# Patient Record
Sex: Female | Born: 1995 | Race: White | Hispanic: Yes | Marital: Single | Smoking: Never smoker
Health system: Southern US, Community
[De-identification: ages and names within clinical notes are randomized; demographics above are authoritative.]

## PROBLEM LIST (undated history)

## (undated) ENCOUNTER — Emergency Department (HOSPITAL_COMMUNITY): Discharge status: Absent without leave | Payer: Worker's Compensation

---
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
## 2015-03-09 ENCOUNTER — Encounter (HOSPITAL_COMMUNITY): Payer: Self-pay | Admitting: *Deleted

## 2015-03-09 ENCOUNTER — Emergency Department (HOSPITAL_COMMUNITY)
Admission: EM | Admit: 2015-03-09 | Discharge: 2015-03-09 | Disposition: A | Discharge status: Home / Self Care | Payer: Medicaid Other | Attending: Emergency Medicine | Admitting: Emergency Medicine

## 2015-03-09 DIAGNOSIS — Z79899 Other long term (current) drug therapy: Secondary | ICD-10-CM | POA: Insufficient documentation

## 2015-03-09 DIAGNOSIS — Z7951 Long term (current) use of inhaled steroids: Secondary | ICD-10-CM | POA: Diagnosis not present

## 2015-03-09 DIAGNOSIS — J069 Acute upper respiratory infection, unspecified: Secondary | ICD-10-CM

## 2015-03-09 DIAGNOSIS — J029 Acute pharyngitis, unspecified: Secondary | ICD-10-CM | POA: Diagnosis present

## 2015-03-09 MED ORDER — CHLORPHENIRAMINE-PSE-IBUPROFEN 2-30-200 MG PO TABS: ORAL_TABLET | ORAL | Status: DC

## 2015-03-09 NOTE — ED Notes (Signed)
Declined W/C at D/C and was escorted to lobby by RN. 

## 2015-03-09 NOTE — Discharge Instructions (Signed)
Your symptoms are likely due to a virus and should resolve on their own in the next 1-2 weeks. Please take your medications as prescribed. Follow-up with your doctor as needed for reevaluation. Return to ED for any new or worsening symptoms area  Upper Respiratory Infection, Adult Most upper respiratory infections (URIs) are a viral infection of the air passages leading to the lungs. A URI affects the nose, throat, and upper air passages. The most common type of URI is nasopharyngitis and is typically referred to as "the common cold." URIs run their course and usually go away on their own. Most of the time, a URI does not require medical attention, but sometimes a bacterial infection in the upper airways can follow a viral infection. This is called a secondary infection. Sinus and middle ear infections are common types of secondary upper respiratory infections. Bacterial pneumonia can also complicate a URI. A URI can worsen asthma and chronic obstructive pulmonary disease (COPD). Sometimes, these complications can require emergency medical care and may be life threatening.  CAUSES Almost all URIs are caused by viruses. A virus is a type of germ and can spread from one person to another.  RISKS FACTORS You may be at risk for a URI if:   You smoke.   You have chronic heart or lung disease.  You have a weakened defense (immune) system.   You are very young or very old.   You have nasal allergies or asthma.  You work in crowded or poorly ventilated areas.  You work in health care facilities or schools. SIGNS AND SYMPTOMS  Symptoms typically develop 2-3 days after you come in contact with a cold virus. Most viral URIs last 7-10 days. However, viral URIs from the influenza virus (flu virus) can last 14-18 days and are typically more severe. Symptoms may include:   Runny or stuffy (congested) nose.   Sneezing.   Cough.   Sore throat.   Headache.   Fatigue.   Fever.   Loss  of appetite.   Pain in your forehead, behind your eyes, and over your cheekbones (sinus pain).  Muscle aches.  DIAGNOSIS  Your health care provider may diagnose a URI by:  Physical exam.  Tests to check that your symptoms are not due to another condition such as:  Strep throat.  Sinusitis.  Pneumonia.  Asthma. TREATMENT  A URI goes away on its own with time. It cannot be cured with medicines, but medicines may be prescribed or recommended to relieve symptoms. Medicines may help:  Reduce your fever.  Reduce your cough.  Relieve nasal congestion. HOME CARE INSTRUCTIONS   Take medicines only as directed by your health care provider.   Gargle warm saltwater or take cough drops to comfort your throat as directed by your health care provider.  Use a warm mist humidifier or inhale steam from a shower to increase air moisture. This may make it easier to breathe.  Drink enough fluid to keep your urine clear or pale yellow.   Eat soups and other clear broths and maintain good nutrition.   Rest as needed.   Return to work when your temperature has returned to normal or as your health care provider advises. You may need to stay home longer to avoid infecting others. You can also use a face mask and careful hand washing to prevent spread of the virus.  Increase the usage of your inhaler if you have asthma.   Do not use any tobacco products, including  cigarettes, chewing tobacco, or electronic cigarettes. If you need help quitting, ask your health care provider. PREVENTION  The best way to protect yourself from getting a cold is to practice good hygiene.   Avoid oral or hand contact with people with cold symptoms.   Wash your hands often if contact occurs.  There is no clear evidence that vitamin C, vitamin E, echinacea, or exercise reduces the chance of developing a cold. However, it is always recommended to get plenty of rest, exercise, and practice good nutrition.    SEEK MEDICAL CARE IF:   You are getting worse rather than better.   Your symptoms are not controlled by medicine.   You have chills.  You have worsening shortness of breath.  You have brown or red mucus.  You have yellow or brown nasal discharge.  You have pain in your face, especially when you bend forward.  You have a fever.  You have swollen neck glands.  You have pain while swallowing.  You have white areas in the back of your throat. SEEK IMMEDIATE MEDICAL CARE IF:   You have severe or persistent:  Headache.  Ear pain.  Sinus pain.  Chest pain.  You have chronic lung disease and any of the following:  Wheezing.  Prolonged cough.  Coughing up blood.  A change in your usual mucus.  You have a stiff neck.  You have changes in your:  Vision.  Hearing.  Thinking.  Mood. MAKE SURE YOU:   Understand these instructions.  Will watch your condition.  Will get help right away if you are not doing well or get worse.   This information is not intended to replace advice given to you by your health care provider. Make sure you discuss any questions you have with your health care provider.   Document Released: 07/06/2000 Document Revised: 05/27/2014 Document Reviewed: 04/17/2013 Elsevier Interactive Patient Education [employer]2016 Elsevier Inc.

## 2015-03-09 NOTE — ED Notes (Signed)
Pt reports bil. Ear pain ,cough and sore throat started on Friday.

## 2015-03-09 NOTE — ED Provider Notes (Signed)
CSN: [id number]     Arrival date & time 03/09/15  1478 History  By signing my name below, I, Paige Dixon, attest that this documentation has been prepared under the direction and in the presence of [name], PA-C.  Electronically Signed: [name], ED Scribe 03/09/2015 at 9:20 AM.   Chief Complaint  Patient presents with  . Otalgia  . Cough  . Sore Throat    The history is provided by the patient. No language interpreter was used.   HPI Comments: Paige Dixon is a 20 y.o. female who presents to the Emergency Department complaining of gradual onset, constant otalgia in both ears, onset last week, worsening 03/06/2015. Pt reports associated rhinorrhea, cough, sore throat, and mild headache. Pt took dayquil, nyquil, and allergy medications last week with no relief of symptoms. No other alleviating or worsening factors noted. Pt denies fever, chills, nausea or vomiting, diarrhea or constipation, abdominal pain, urinary symptoms and any other associated symptoms. No other modifying factors  History reviewed. No pertinent past medical history. History reviewed. No pertinent past surgical history. History reviewed. No pertinent family history. Social History  Substance Use Topics  . Smoking status: Never Smoker   . Smokeless tobacco: None  . Alcohol Use: No   OB History    No data available     Review of Systems  Constitutional: Negative for fever.  HENT: Positive for rhinorrhea and sore throat.   Respiratory: Positive for cough.   Neurological: Positive for headaches.  All other systems reviewed and are negative.   Allergies  Review of patient's allergies indicates no known allergies.  Home Medications   Prior to Admission medications   Medication Sig Start Date End Date Taking? Authorizing Provider  amoxicillin (AMOXIL) 875 MG tablet Take 1 tablet (875 mg total) by mouth 2 (two) times daily. 09/14/12  Yes Graylon Good, PA-C  cyclobenzaprine (FLEXERIL) 10  MG tablet Take 1 tablet (10 mg total) by mouth 2 (two) times daily as needed for muscle spasms. 01/13/14  Yes Shari Upstill, PA-C  fluticasone (FLONASE) 50 MCG/ACT nasal spray Place 2 sprays into both nostrils daily. 12/18/13  Yes Hannah Muthersbaugh, PA-C  HYDROcodone-acetaminophen (NORCO/VICODIN) 5-325 MG per tablet Take 1-2 tablets by mouth every 4 (four) hours as needed. 01/13/14  Yes Shari Upstill, PA-C  ibuprofen (ADVIL,MOTRIN) 800 MG tablet Take 1 tablet (800 mg total) by mouth 3 (three) times daily. 01/13/14  Yes Elpidio Anis, PA-C  methylPREDNISolone (MEDROL DOSEPAK) 4 MG tablet Use as directed 09/14/12  Yes Graylon Good, PA-C  OVER THE COUNTER MEDICATION Take 10 mLs by mouth every 6 (six) hours as needed. dayquil day and night for cold symptoms   Yes Historical Provider, MD  Chlorpheniramine-PSE-Ibuprofen (ADVIL ALLERGY SINUS) 2-30-200 MG TABS 2 tabs PO QID PRN 03/09/15   Joycie Peek, PA-C   BP 108/62 mmHg  Pulse 81  Temp(Src) 97.7 F (36.5 C) (Oral)  Resp 16  Ht  (1.702 m)  Wt 122 lb (55.339 kg)  BMI 19.10 kg/m2  SpO2 100%  LMP 02/20/2015 Physical Exam  Constitutional: She appears well-developed and well-nourished. No distress.  HENT:  Head: Normocephalic and atraumatic.  Mouth/Throat: Posterior oropharyngeal erythema present.  Left ear posterior lymphadenopathy. No mastoid tenderness, erythema. TM normal bilaterally.  No cervical lymphadenopathy.  Mild errythema to posterior oropharynx.     Eyes: Conjunctivae and EOM are normal.  Neck: Neck supple. No tracheal deviation present.  Cardiovascular: Normal rate, regular rhythm and normal heart sounds.  Pulmonary/Chest: Effort normal and breath sounds normal. No respiratory distress. She has no wheezes. She has no rales.  Musculoskeletal: Normal range of motion.  Neurological: She is alert.  Skin: Skin is warm and dry.  Psychiatric: She has a normal mood and affect. Her behavior is normal.    ED Course   Procedures (including critical care time) DIAGNOSTIC STUDIES: Oxygen Saturation is 100% on RA, normal by my interpretation.    COORDINATION OF CARE: 9:17 AM-Discussed treatment plan which includes decongestant and cold medications to treat symptoms with pt at bedside and pt agreed to plan.   Labs Review Labs Reviewed - No data to display  Imaging Review No results found.   EKG Interpretation None     Meds given in ED:  Medications - No data to display  Discharge Medication List as of 03/09/2015  9:21 AM    \ Filed Vitals:   03/09/15 0847  BP: 108/62  Pulse: 81  Temp: 97.7 F (36.5 C)  TempSrc: Oral  Resp: 16  Height:  (1.702 m)  Weight: 55.339 kg  SpO2: 100%     MDM  Presents for evaluation of cough, runny nose, nasal congestion, otalgia. No objective findings on physical exam. Patients symptoms are consistent with URI, likely viral etiology. Discussed that antibiotics are not indicated for viral infections. Pt will be discharged with symptomatic treatment.  Verbalizes understanding and is agreeable with plan. Pt is hemodynamically stable, afebrile & in NAD prior to dc.  Final diagnoses:  URI (upper respiratory infection)    I personally performed the services described in this documentation, which was scribed in my presence. The recorded information has been reviewed and is accurate.    Joycie Peek, PA-C 03/09/15 1610  Arby Barrette, MD 03/11/15 [phone]
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
## 2015-08-13 ENCOUNTER — Encounter (HOSPITAL_COMMUNITY): Payer: Self-pay | Admitting: Emergency Medicine

## 2015-08-13 ENCOUNTER — Ambulatory Visit (HOSPITAL_COMMUNITY)
Admission: EM | Admit: 2015-08-13 | Discharge: 2015-08-13 | Disposition: A | Discharge status: Home / Self Care | Payer: Medicaid Other | Attending: Emergency Medicine | Admitting: Emergency Medicine

## 2015-08-13 DIAGNOSIS — B351 Tinea unguium: Secondary | ICD-10-CM | POA: Diagnosis not present

## 2015-08-13 MED ORDER — EFINACONAZOLE 10 % EX SOLN: CUTANEOUS | Status: DC

## 2015-08-13 NOTE — Discharge Instructions (Signed)
Nail Ringworm A fungal infection of the nail (tinea unguium/onychomycosis) is common. It is common as the visible part of the nail is composed of dead cells which have no blood supply to help prevent infection. It occurs because fungi are everywhere and will pick any opportunity to grow on any dead material. Because nails are very slow growing they require up to 2 years of treatment with anti-fungal medications. The entire nail back to the base is infected. This includes approximately  of the nail which you cannot see. If your caregiver has prescribed a medication by mouth, take it every day and as directed. No progress will be seen for at least 6 to 9 months. Do not be disappointed! Because fungi live on dead cells with little or no exposure to blood supply, medication delivery to the infection is slow; thus the cure is slow. It is also why you can observe no progress in the first 6 months. The nail becoming cured is the base of the nail, as it has the blood supply. Topical medication such as creams and ointments are usually not effective. Important in successful treatment of nail fungus is closely following the medication regimen that your doctor prescribes. Sometimes you and your caregiver may elect to speed up this process by surgical removal of all the nails. Even this may still require 6 to 9 months of additional oral medications. See your caregiver as directed. Remember there will be no visible improvement for at least 6 months. See your caregiver sooner if other signs of infection (redness and swelling) develop.   This information is not intended to replace advice given to you by your health care provider. Make sure you discuss any questions you have with your health care provider.   Document Released: 01/08/2000 Document Revised: 05/27/2014 Document Reviewed: 07/14/2014 Elsevier Interactive Patient Education 2016 Elsevier Inc.  

## 2015-08-13 NOTE — ED Notes (Signed)
Pt states she has had a nail fungus on both great toes for a week or two.  She has cut off the part of the nail she said was affected.  There is some redness at the top of her toes.  Pt denies any pain or fever associated.

## 2015-08-13 NOTE — ED Provider Notes (Addendum)
CSN: [id number]     Arrival date & time 08/13/15  1550 History   First MD Initiated Contact with Patient 08/13/15 1702     Chief Complaint  Patient presents with  . Nail Problem   (Consider location/radiation/quality/duration/timing/severity/associated sxs/prior Treatment) HPI History obtained from patient:  Pt presents with the cc of:  Fungal infection toenails Duration of symptoms: 2 weeks Treatment prior to arrival: Trimming toenails Context: Patient works at [employer] where she has to wear plastic shoes her feet have been getting moist and now she is developing fungus under both great toenails. The left great toenail has lifted from the nail bed and has been partially removed. Other symptoms include: None Pain score: 0 FAMILY HISTORY: None     History reviewed. No pertinent past medical history. History reviewed. No pertinent past surgical history. History reviewed. No pertinent family history. Social History  Substance Use Topics  . Smoking status: Never Smoker   . Smokeless tobacco: None  . Alcohol Use: No   OB History    No data available     Review of Systems  Denies: HEADACHE, NAUSEA, ABDOMINAL PAIN, CHEST PAIN, CONGESTION, DYSURIA, SHORTNESS OF BREATH  Allergies  Review of patient's allergies indicates no known allergies.  Home Medications   Prior to Admission medications   Medication Sig Start Date End Date Taking? Authorizing Provider  amoxicillin (AMOXIL) 875 MG tablet Take 1 tablet (875 mg total) by mouth 2 (two) times daily. 09/14/12   Graylon Good, PA-C  Chlorpheniramine-PSE-Ibuprofen (ADVIL ALLERGY SINUS) 2-30-200 MG TABS 2 tabs PO QID PRN 03/09/15   Joycie Peek, PA-C  cyclobenzaprine (FLEXERIL) 10 MG tablet Take 1 tablet (10 mg total) by mouth 2 (two) times daily as needed for muscle spasms. 01/13/14   Elpidio Anis, PA-C  fluticasone (FLONASE) 50 MCG/ACT nasal spray Place 2 sprays into both nostrils daily. 12/18/13   Hannah Muthersbaugh,  PA-C  HYDROcodone-acetaminophen (NORCO/VICODIN) 5-325 MG per tablet Take 1-2 tablets by mouth every 4 (four) hours as needed. 01/13/14   Elpidio Anis, PA-C  ibuprofen (ADVIL,MOTRIN) 800 MG tablet Take 1 tablet (800 mg total) by mouth 3 (three) times daily. 01/13/14   Elpidio Anis, PA-C  methylPREDNISolone (MEDROL DOSEPAK) 4 MG tablet Use as directed 09/14/12   Graylon Good, PA-C  OVER THE COUNTER MEDICATION Take 10 mLs by mouth every 6 (six) hours as needed. dayquil day and night for cold symptoms    Historical Provider, MD   Meds Ordered and Administered this Visit  Medications - No data to display  BP 114/62 mmHg  Pulse 66  Temp(Src) 98.7 F (37.1 C) (Oral)  Resp 12  SpO2 99% No data found.   Physical Exam NURSES NOTES AND VITAL SIGNS REVIEWED. CONSTITUTIONAL: Well developed, well nourished, no acute distress HEENT: normocephalic, atraumatic EYES: Conjunctiva normal NECK:normal ROM, supple, no adenopathy PULMONARY:No respiratory distress, normal effort ABDOMINAL: Soft, ND, NT BS+, No CVAT MUSCULOSKELETAL: Normal ROM of all extremities, Both great toe nails noted with the fungus. The remainder of the toes are clear. The left great toenail has been partially removed as the fungus lifted the toenail from the nail bed. SKIN: warm and dry without rash PSYCHIATRIC: Mood and affect, behavior are normal  ED Course  Procedures (including critical care time)  Labs Review Labs Reviewed - No data to display  Imaging Review No results found.   Visual Acuity Review  Right Eye Distance:   Left Eye Distance:   Bilateral Distance:    Right  Eye Near:   Left Eye Near:    Bilateral Near:         MDM   1. Onychomycosis of toenail     Patient is reassured that there are no issues that require transfer to higher level of care at this time or additional tests. Patient is advised to continue home symptomatic treatment. Patient is advised that if there are new or worsening  symptoms to attend the emergency department, contact primary care provider, or return to UC. Instructions of care provided discharged home in stable condition.    THIS NOTE WAS GENERATED USING A VOICE RECOGNITION SOFTWARE PROGRAM. ALL REASONABLE EFFORTS  WERE MADE TO PROOFREAD THIS DOCUMENT FOR ACCURACY.  I have verbally reviewed the discharge instructions with the patient. A printed AVS was given to the patient.  All questions were answered prior to discharge.      Tharon AquasFrank C Patrick, PA 08/13/15 1842  Pharmacy called regarding prescription for Efinaconazole solution.  This requires a prior authorization from her insurance.  Prescription changed to oral terbinafine 250mg  PO daily x6 weeks.  Charm RingsErin J Blanchie Zeleznik, MD 08/17/15 [phone]

## 2016-01-28 ENCOUNTER — Emergency Department (HOSPITAL_COMMUNITY)
Admission: EM | Admit: 2016-01-28 | Discharge: 2016-01-28 | Disposition: A | Discharge status: Home / Self Care | Payer: Medicaid Other | Attending: Emergency Medicine | Admitting: Emergency Medicine

## 2016-01-28 ENCOUNTER — Encounter (HOSPITAL_COMMUNITY): Payer: Self-pay | Admitting: Nurse Practitioner

## 2016-01-28 DIAGNOSIS — H9393 Unspecified disorder of ear, bilateral: Secondary | ICD-10-CM | POA: Insufficient documentation

## 2016-01-28 DIAGNOSIS — J069 Acute upper respiratory infection, unspecified: Secondary | ICD-10-CM

## 2016-01-28 DIAGNOSIS — H938X3 Other specified disorders of ear, bilateral: Secondary | ICD-10-CM

## 2016-01-28 DIAGNOSIS — R05 Cough: Secondary | ICD-10-CM | POA: Diagnosis present

## 2016-01-28 MED ORDER — PSEUDOEPHEDRINE HCL ER 120 MG PO TB12: 120.0000 mg | ORAL_TABLET | Freq: Two times a day (BID) | ORAL | 0 refills | Status: DC

## 2016-01-28 MED ORDER — NAPROXEN 500 MG PO TABS: 500.0000 mg | ORAL_TABLET | Freq: Two times a day (BID) | ORAL | 0 refills | Status: DC

## 2016-01-28 NOTE — ED Notes (Signed)
EDP at bedside  

## 2016-01-28 NOTE — ED Notes (Signed)
Video visit coupon number and handout given to patient on discharge.

## 2016-01-28 NOTE — ED Provider Notes (Signed)
[facility]-EMERGENCY DEPT Provider Note    By signing my name below, I, Earmon PhoenixJennifer Waddell, attest that this documentation has been prepared under the direction and in the presence of Arthor CaptainAbigail Basma Buchner, PA-C. Electronically Signed: Earmon PhoenixJennifer Waddell, ED Scribe. 01/28/16. 3:30 PM.   History   Chief Complaint Chief Complaint  Patient presents with  . URI   HPI  Paige JaffeJennifer Dixon is a 21 y.o. female who presents to the Emergency Department complaining of URI symptoms that began two days ago. She reports associated otalgia, nasal congestion, sore throat, sinus pain, cough and HA. She has taken Ibuprofen 400 mg and cough drops with no significant relief. Pt denies modifying factors. She denies fever, chills, nausea, vomiting, difficulty breathing or swallowing.   History reviewed. No pertinent past medical history.  There are no active problems to display for this patient.   History reviewed. No pertinent surgical history.  OB History    No data available       Home Medications    Prior to Admission medications   Medication Sig Start Date End Date Taking? Authorizing Provider  amoxicillin (AMOXIL) 875 MG tablet Take 1 tablet (875 mg total) by mouth 2 (two) times daily. 09/14/12   Graylon GoodZachary H Baker, PA-C  Chlorpheniramine-PSE-Ibuprofen (ADVIL ALLERGY SINUS) 2-30-200 MG TABS 2 tabs PO QID PRN 03/09/15   Joycie PeekBenjamin Cartner, PA-C  cyclobenzaprine (FLEXERIL) 10 MG tablet Take 1 tablet (10 mg total) by mouth 2 (two) times daily as needed for muscle spasms. 01/13/14   Elpidio AnisShari Upstill, PA-C  Efinaconazole 10 % SOLN Apply to affected nails daily for 7 days. At the end of 7 days scrape off residue. 08/13/15   Tharon AquasFrank C Patrick, PA  fluticasone (FLONASE) 50 MCG/ACT nasal spray Place 2 sprays into both nostrils daily. 12/18/13   Hannah Muthersbaugh, PA-C  HYDROcodone-acetaminophen (NORCO/VICODIN) 5-325 MG per tablet Take 1-2 tablets by mouth every 4 (four) hours as needed. 01/13/14   Elpidio AnisShari Upstill, PA-C  ibuprofen  (ADVIL,MOTRIN) 800 MG tablet Take 1 tablet (800 mg total) by mouth 3 (three) times daily. 01/13/14   Elpidio AnisShari Upstill, PA-C  methylPREDNISolone (MEDROL DOSEPAK) 4 MG tablet Use as directed 09/14/12   Graylon GoodZachary H Baker, PA-C  naproxen (NAPROSYN) 500 MG tablet Take 1 tablet (500 mg total) by mouth 2 (two) times daily with a meal. 01/28/16   Arthor CaptainAbigail Ashlin Kreps, PA-C  OVER THE COUNTER MEDICATION Take 10 mLs by mouth every 6 (six) hours as needed. dayquil day and night for cold symptoms    Historical Provider, MD  pseudoephedrine (SUDAFED 12 HOUR) 120 MG 12 hr tablet Take 1 tablet (120 mg total) by mouth 2 (two) times daily. 01/28/16   Arthor CaptainAbigail Amy Gothard, PA-C    Family History History reviewed. No pertinent family history.  Social History Social History  Substance Use Topics  . Smoking status: Never Smoker  . Smokeless tobacco: Never Used  . Alcohol use No     Allergies   Patient has no known allergies.   Review of Systems Review of Systems A complete 10 system review of systems was obtained and all systems are negative except as noted in the HPI and PMH.    Physical Exam Updated Vital Signs BP (!) 102/48   Pulse 83   Temp 98.8 F (37.1 C) (Oral)   Resp 18   SpO2 100%   Physical Exam  Constitutional: She is oriented to person, place, and time. She appears well-developed and well-nourished.  HENT:  Head: Normocephalic and atraumatic.  Right Ear: Tympanic membrane  is not erythematous. A middle ear effusion is present.  Left Ear: Tympanic membrane is not erythematous. A middle ear effusion is present.  Neck: Normal range of motion.  Cardiovascular: Normal rate, regular rhythm and normal heart sounds.   Pulmonary/Chest: Effort normal and breath sounds normal.  Musculoskeletal: Normal range of motion.  Lymphadenopathy:    She has cervical adenopathy.  Neurological: She is alert and oriented to person, place, and time.  Skin: Skin is warm and dry.  Psychiatric: She has a normal mood and  affect. Her behavior is normal.  Nursing note and vitals reviewed.    ED Treatments / Results  DIAGNOSTIC STUDIES: Oxygen Saturation is 100% on RA, normal by my interpretation.   COORDINATION OF CARE: 3:15 PM- Will treat symptomatically. Return precautions discussed. Pt verbalizes understanding and agrees to plan.  Medications - No data to display  Labs (all labs ordered are listed, but only abnormal results are displayed) Labs Reviewed - No data to display  EKG  EKG Interpretation None       Radiology No results found.  Procedures Procedures (including critical care time)  Medications Ordered in ED Medications - No data to display   Initial Impression / Assessment and Plan / ED Course  I have reviewed the triage vital signs and the nursing notes.  Pertinent labs & imaging results that were available during my care of the patient were reviewed by me and considered in my medical decision making (see chart for details).  Clinical Course     Pt symptoms consistent with URI. Pt will be discharged with symptomatic treatment.  Discussed return precautions.  Pt is hemodynamically stable & in NAD prior to discharge.   I personally performed the services described in this documentation, which was scribed in my presence. The recorded information has been reviewed and is accurate.     Final Clinical Impressions(s) / ED Diagnoses   Final diagnoses:  Upper respiratory tract infection, unspecified type  Congestion of both ears    New Prescriptions Discharge Medication List as of 01/28/2016  3:35 PM    START taking these medications   Details  naproxen (NAPROSYN) 500 MG tablet Take 1 tablet (500 mg total) by mouth 2 (two) times daily with a meal., Starting Thu 01/28/2016, Print    pseudoephedrine (SUDAFED 12 HOUR) 120 MG 12 hr tablet Take 1 tablet (120 mg total) by mouth 2 (two) times daily., Starting Thu 01/28/2016, Print         Arthor Captain, PA-C 02/02/16  1817    Laurence Spates, MD 02/03/16 [phone]

## 2016-01-28 NOTE — Discharge Instructions (Signed)
Contact a health care provider if:  Your symptoms do not go away after treatment.  Your symptoms come back after treatment.  You are unable to pop your ears.  You have:  A fever.  Pain in your ear.  Pain in your head or neck.  Fluid draining from your ear.  Your hearing suddenly changes.  You become very dizzy.  You lose your balance.

## 2016-01-28 NOTE — ED Triage Notes (Signed)
Pt presents with c/o URI symptoms. The symptoms began 2 days ago. She reports headaches, ear pain, nasal congestion, sore throat, cough. She denies any fevers. She has tried ibuprofen, cough drops, teas at home with no relief.

## 2017-12-24 DEATH — deceased

## 2019-05-18 ENCOUNTER — Other Ambulatory Visit: Payer: Self-pay

## 2019-05-18 ENCOUNTER — Emergency Department (HOSPITAL_COMMUNITY)
Admission: EM | Admit: 2019-05-18 | Discharge: 2019-05-18 | Disposition: A | Discharge status: Home / Self Care | Payer: Worker's Compensation | Attending: Emergency Medicine | Admitting: Emergency Medicine

## 2019-05-18 ENCOUNTER — Encounter (HOSPITAL_COMMUNITY): Payer: Self-pay | Admitting: Emergency Medicine

## 2019-05-18 DIAGNOSIS — W25XXXA Contact with sharp glass, initial encounter: Secondary | ICD-10-CM | POA: Insufficient documentation

## 2019-05-18 DIAGNOSIS — Y990 Civilian activity done for income or pay: Secondary | ICD-10-CM | POA: Diagnosis not present

## 2019-05-18 DIAGNOSIS — Y9389 Activity, other specified: Secondary | ICD-10-CM | POA: Diagnosis not present

## 2019-05-18 DIAGNOSIS — Z79899 Other long term (current) drug therapy: Secondary | ICD-10-CM | POA: Insufficient documentation

## 2019-05-18 DIAGNOSIS — S81811A Laceration without foreign body, right lower leg, initial encounter: Secondary | ICD-10-CM | POA: Diagnosis not present

## 2019-05-18 DIAGNOSIS — Z23 Encounter for immunization: Secondary | ICD-10-CM | POA: Insufficient documentation

## 2019-05-18 DIAGNOSIS — Y92129 Unspecified place in nursing home as the place of occurrence of the external cause: Secondary | ICD-10-CM | POA: Insufficient documentation

## 2019-05-18 MED ORDER — LIDOCAINE-EPINEPHRINE (PF) 2 %-1:200000 IJ SOLN
10.0000 mL | Freq: Once | INTRAMUSCULAR | Status: AC
  Administered 2019-05-18: 10 mL via INTRADERMAL
  Filled 2019-05-18: qty 20

## 2019-05-18 MED ORDER — TETANUS-DIPHTH-ACELL PERTUSSIS 5-2.5-18.5 LF-MCG/0.5 IM SUSP
0.5000 mL | Freq: Once | INTRAMUSCULAR | Status: AC
  Administered 2019-05-18: 20:00:00 0.5 mL via INTRAMUSCULAR
  Filled 2019-05-18: qty 0.5

## 2019-05-18 NOTE — ED Provider Notes (Signed)
[facility] DEPT Provider Note   CSN: [id number] Arrival date & time: 05/18/19  1845     History No chief complaint on file.   Paige Dixon is a 24 y.o. female.  24 y.o female with no medical history presents to the ED with complaints of laceration.  Patient reports she was at work, currently employed at [employer] was at [employer], states she had glass fall on the right lateral aspect of her leg.  Reports bleeding occurred at the time, it was controlled with pressure and a pressure dressing.  Has not taken any medication for pain.  Is ambulatory in the ED with a steady gait.  Last tetanus vaccine status is unknown.No other injuries or complaints.   The history is provided by the patient.       History reviewed. No pertinent past medical history.  There are no problems to display for this patient.   History reviewed. No pertinent surgical history.   OB History   No obstetric history on file.     No family history on file.  Social History   Tobacco Use  . Smoking status: Never Smoker  . Smokeless tobacco: Never Used  Substance Use Topics  . Alcohol use: No  . Drug use: No    Home Medications Prior to Admission medications   Medication Sig Start Date End Date Taking? Authorizing Provider  amoxicillin (AMOXIL) 875 MG tablet Take 1 tablet (875 mg total) by mouth 2 (two) times daily. 09/14/12   Liam Graham, PA-C  Chlorpheniramine-PSE-Ibuprofen (ADVIL ALLERGY SINUS) 2-30-200 MG TABS 2 tabs PO QID PRN 03/09/15   Cartner, Marland Kitchen, PA-C  cyclobenzaprine (FLEXERIL) 10 MG tablet Take 1 tablet (10 mg total) by mouth 2 (two) times daily as needed for muscle spasms. 01/13/14   Charlann Lange, PA-C  Efinaconazole 10 % SOLN Apply to affected nails daily for 7 days. At the end of 7 days scrape off residue. 08/13/15   Konrad Felix, PA  fluticasone (FLONASE) 50 MCG/ACT nasal spray Place 2 sprays into both nostrils daily. 12/18/13    Muthersbaugh, Jarrett Soho, PA-C  HYDROcodone-acetaminophen (NORCO/VICODIN) 5-325 MG per tablet Take 1-2 tablets by mouth every 4 (four) hours as needed. 01/13/14   Charlann Lange, PA-C  ibuprofen (ADVIL,MOTRIN) 800 MG tablet Take 1 tablet (800 mg total) by mouth 3 (three) times daily. 01/13/14   Charlann Lange, PA-C  methylPREDNISolone (MEDROL DOSEPAK) 4 MG tablet Use as directed 09/14/12   Allena Katz H, PA-C  naproxen (NAPROSYN) 500 MG tablet Take 1 tablet (500 mg total) by mouth 2 (two) times daily with a meal. 01/28/16   Harris, Abigail, PA-C  OVER THE COUNTER MEDICATION Take 10 mLs by mouth every 6 (six) hours as needed. dayquil day and night for cold symptoms    [provider]  pseudoephedrine (SUDAFED 12 HOUR) 120 MG 12 hr tablet Take 1 tablet (120 mg total) by mouth 2 (two) times daily. 01/28/16   Margarita Mail, PA-C    Allergies    Patient has no known allergies.  Review of Systems   Review of Systems  Constitutional: Negative for fever.  Skin: Positive for wound.    Physical Exam Updated Vital Signs BP (!) 126/54 (BP Location: Left Arm)   Pulse 85   Temp 99 F (37.2 C) (Oral)   Resp 18   Ht 5\' 7"  (1.702 m)   Wt 73.5 kg   SpO2 100%   BMI 25.37 kg/m   Physical Exam Vitals  and nursing note reviewed.  Constitutional:      Appearance: Normal appearance.  HENT:     Head: Normocephalic and atraumatic.     Mouth/Throat:     Mouth: Mucous membranes are moist.  Cardiovascular:     Rate and Rhythm: Normal rate.  Pulmonary:     Effort: Pulmonary effort is normal.  Abdominal:     General: Abdomen is flat.  Musculoskeletal:     Cervical back: Normal range of motion and neck supple.  Skin:    General: Skin is warm and dry.     Findings: Erythema and laceration present.          Comments: 1 cm superficial laceration to the lateral aspect of right leg.   Neurological:     Mental Status: She is alert and oriented to person, place, and time.     ED Results /  Procedures / Treatments   Labs (all labs ordered are listed, but only abnormal results are displayed) Labs Reviewed - No data to display  EKG None  Radiology No results found.  Procedures .Marland KitchenLaceration Repair  Date/Time: 05/18/2019 7:57 PM Performed by: Claude Manges, PA-C Authorized by: Claude Manges, PA-C   Consent:    Consent obtained:  Verbal   Consent given by:  Patient   Risks discussed:  Pain, poor cosmetic result and infection Anesthesia (see MAR for exact dosages):    Anesthesia method:  Local infiltration   Local anesthetic:  Lidocaine 2% WITH epi Laceration details:    Location:  Leg   Leg location:  R lower leg   Length (cm):  1   Depth (mm):  0.5 Repair type:    Repair type:  Simple Exploration:    Hemostasis achieved with:  Direct pressure Treatment:    Amount of cleaning:  Extensive   Irrigation solution:  Sterile water Skin repair:    Repair method:  Sutures   Suture size:  4-0   Suture material:  Prolene   Suture technique:  Simple interrupted   Number of sutures:  3 Approximation:    Approximation:  Close Post-procedure details:    Dressing:  Non-adherent dressing   Patient tolerance of procedure:  Tolerated well, no immediate complications   (including critical care time)  Medications Ordered in ED Medications  Tdap (BOOSTRIX) injection 0.5 mL (has no administration in time range)  lidocaine-EPINEPHrine (XYLOCAINE W/EPI) 2 %-1:200000 (PF) injection 10 mL (has no administration in time range)    ED Course  I have reviewed the triage vital signs and the nursing notes.  Pertinent labs & imaging results that were available during my care of the patient were reviewed by me and considered in my medical decision making (see chart for details).    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  Breathing is controlled with pressure.  Vitals are within normal limits, last tetanus vaccine is unknown.  Patient does report there is a Teacher, adult education. accident however not have any paperwork on her at this time.  Wound will be irrigated along with further repair by me, tetanus vaccine will also be updated.  Tetanus vaccine was updated, wound irrigated extensively, I have placed 3 stiches to your right leg, she will have these removed within 7-10 days. Return precautions discussed at length.  Portions of this note were generated with Scientist, clinical (histocompatibility and immunogenetics). Dictation errors may occur despite best attempts at proofreading.  Final Clinical Impression(s) / ED Diagnoses Final diagnoses:  Leg laceration, right, initial encounter    Rx / DC Orders ED Discharge Orders    None       Freddy Jaksch 05/18/19 Kandis Nab, MD 05/19/19 [phone]

## 2019-05-18 NOTE — Discharge Instructions (Addendum)
I have placed 3 sutures to your right leg, please have these removed within 7-10 days.   Your vaccine was updated on today's visit.

## 2019-05-18 NOTE — ED Triage Notes (Signed)
Patient states that she got cut with a glass at work. About 1 inche laceration to R anterior tibia, fatty tissue exposed. Dressing applied.

## 2019-05-18 NOTE — ED Notes (Signed)
PT wound continued to bleed through bandages, new gauze pads applied, kerlix wrapped.

## 2019-11-30 ENCOUNTER — Other Ambulatory Visit: Payer: Self-pay

## 2019-11-30 DIAGNOSIS — Z20822 Contact with and (suspected) exposure to covid-19: Secondary | ICD-10-CM

## 2019-12-01 LAB — NOVEL CORONAVIRUS, NAA: SARS-CoV-2, NAA: NOT DETECTED

## 2019-12-01 LAB — SARS-COV-2, NAA 2 DAY TAT

## 2023-10-26 ENCOUNTER — Encounter: Payer: Self-pay | Admitting: Family Medicine

## 2023-10-26 ENCOUNTER — Ambulatory Visit (INDEPENDENT_AMBULATORY_CARE_PROVIDER_SITE_OTHER): Admitting: Family Medicine

## 2023-10-26 VITALS — BP 96/68 | HR 67 | Resp 16 | Ht 67.0 in | Wt 162.0 lb

## 2023-10-26 DIAGNOSIS — Z7689 Persons encountering health services in other specified circumstances: Secondary | ICD-10-CM | POA: Diagnosis not present

## 2023-10-26 DIAGNOSIS — Z0000 Encounter for general adult medical examination without abnormal findings: Secondary | ICD-10-CM

## 2023-10-26 NOTE — Patient Instructions (Signed)
 Schedule nurse visit for TB skin test. This needs to be read within 48-72 hours after receiving the injection.  Can obtain FASTING labs the same day. Do not eat anything after midnight. You may drink water and plain coffee.  I can complete your health examination paperwork AFTER we read the TB test.  You can schedule a physical and we can complete your cervical cancer screening (pap smear) at the time. This does NOT have to be on the same day as the nurse visit.   MyChart:  For all urgent or time sensitive needs we ask that you please call the office to avoid delays. Our number is [phone]) [phone]. MyChart is not constantly monitored and due to the large volume of messages a day, replies may take up to 72 business hours.   MyChart Policy: MyChart allows for you to see your visit notes, after visit summary, provider recommendations, lab and tests results, make an appointment, request refills, and contact your provider or the office for non-urgent questions or concerns. Providers are seeing patients during normal business hours and do not have built in time to review MyChart messages.  We ask that you allow a minimum of 3 business days for responses to [employer]. For this reason, please do not send urgent requests through MyChart. Please call the office at [phone]. New and ongoing conditions may require a visit. We have virtual and in person visit available for your convenience.  Complex MyChart concerns may require a visit. Your provider may request you schedule a virtual or in person visit to ensure we are providing the best care possible. MyChart messages sent after 11:00 AM on Friday will not be received by the provider until Monday morning.    Lab and Test Results: You will receive your lab and test results on MyChart as soon as they are completed and results have been sent by the lab or testing facility. Due to this service, you will receive your results BEFORE your provider.  I  review lab and tests results each morning prior to seeing patients. Some results require collaboration with other providers to ensure you are receiving the most appropriate care. For this reason, we ask that you please allow a minimum of 3-5 business days from the time the ALL results have been received for your provider to receive and review lab and test results and contact you about these.  Most lab and test result comments from the provider will be sent through MyChart. Your provider may recommend changes to the plan of care, follow-up visits, repeat testing, ask questions, or request an office visit to discuss these results. You may reply directly to this message or call the office at [phone] to provide information for the provider or set up an appointment. In some instances, you will be called with test results and recommendations. Please let us  know if this is preferred and we will make note of this in your chart to provide this for you.    If you have not heard a response to your lab or test results in 5 business days from all results returning to MyChart, please call the office to let us  know. We ask that you please avoid calling prior to this time unless there is an emergent concern. Due to high call volumes, this can delay the resulting process.   After Hours: For all non-emergency after hours needs, please call the office at [phone] and select the option to reach the on-call provider  service. On-call services are shared between multiple [facility] offices and therefore it will not be possible to speak directly with your provider. On-call providers may provide medical advice and recommendations, but are unable to provide refills for maintenance medications.  For all emergency or urgent medical needs after normal business hours, we recommend that you seek care at the closest Urgent Care or Emergency Department to ensure appropriate treatment in a timely manner.  [facility]  at [facility] has a 24 hour emergency room located on the ground floor for your convenience.    Urgent Concerns During the Business Day Providers are seeing patients from 8AM to 5PM, Monday through Thursday, and 8AM to 12PM on Friday with a busy schedule and are most often not able to respond to non-urgent calls until the end of the day or the next business day. If you should have URGENT concerns during the day, please call and speak to the nurse or schedule a same day appointment so that we can address your concern without delay.    Thank you, again, for choosing me as your health care partner. I appreciate your trust and look forward to learning more about you.    Evalene Arts, FNP-C

## 2023-10-26 NOTE — Progress Notes (Signed)
 New Patient Office Visit  Subjective   Patient ID: Paige Dixon, female    DOB: 1995-02-26  Age: 28 y.o. MRN: [id number]  CC:  Chief Complaint  Patient presents with   Establish Care   Discussed the use of AI scribe software for clinical note transcription with the patient, who gave verbal consent to proceed.  History of Present Illness   Paige Dixon is a 28 year old female who presents to establish care with [facility] She has seen [facility] in the past, last time was around 2021.  She is concerned about her prediabetes status, having been previously informed that she was close to the threshold. Since then, she has lost a significant amount of weight and is curious if her status has changed. She is not currently on any medications.  She requires a health examination for work, which includes a TB test and verification of immunization records. She did not bring the form required for the exam but plans to upload it through MyChart. She is from [place] .  Her family history is significant for diabetes on both sides of her grandparents. No history of high cholesterol, high blood pressure, or kidney disease.     Outpatient Encounter Medications as of 10/26/2023  Medication Sig   [DISCONTINUED] amoxicillin  (AMOXIL ) 875 MG tablet Take 1 tablet (875 mg total) by mouth 2 (two) times daily.   [DISCONTINUED] Chlorpheniramine -PSE-Ibuprofen  (ADVIL  ALLERGY SINUS) 2-30-200 MG TABS 2 tabs PO QID PRN   [DISCONTINUED] cyclobenzaprine  (FLEXERIL ) 10 MG tablet Take 1 tablet (10 mg total) by mouth 2 (two) times daily as needed for muscle spasms.   [DISCONTINUED] Efinaconazole  10 % SOLN Apply to affected nails daily for 7 days. At the end of 7 days scrape off residue.   [DISCONTINUED] fluticasone  (FLONASE ) 50 MCG/ACT nasal spray Place 2 sprays into both nostrils daily.   [DISCONTINUED] HYDROcodone -acetaminophen  (NORCO/VICODIN) 5-325 MG per tablet Take 1-2 tablets by mouth  every 4 (four) hours as needed.   [DISCONTINUED] ibuprofen  (ADVIL ,MOTRIN ) 800 MG tablet Take 1 tablet (800 mg total) by mouth 3 (three) times daily.   [DISCONTINUED] methylPREDNISolone  (MEDROL  DOSEPAK) 4 MG tablet Use as directed   [DISCONTINUED] naproxen  (NAPROSYN ) 500 MG tablet Take 1 tablet (500 mg total) by mouth 2 (two) times daily with a meal.   [DISCONTINUED] OVER THE COUNTER MEDICATION Take 10 mLs by mouth every 6 (six) hours as needed. dayquil day and night for cold symptoms   [DISCONTINUED] pseudoephedrine  (SUDAFED 12 HOUR) 120 MG 12 hr tablet Take 1 tablet (120 mg total) by mouth 2 (two) times daily.   No facility-administered encounter medications on file as of 10/26/2023.   There are no active problems to display for this patient.  History reviewed. No pertinent past medical history. History reviewed. No pertinent surgical history. History reviewed. No pertinent family history. Social History   Socioeconomic History   Marital status: Single    Spouse name: Not on file   Number of children: Not on file   Years of education: Not on file   Highest education level: Not on file  Occupational History   Not on file  Tobacco Use   Smoking status: Never   Smokeless tobacco: Never  Substance and Sexual Activity   Alcohol use: No   Drug use: No   Sexual activity: Not on file  Other Topics Concern   Not on file  Social History Narrative   Not on file   Social Drivers of Health  Financial Resource Strain: Not on file  Food Insecurity: No Food Insecurity (02/21/2020)   Received from [facility]   Hunger Vital Sign    Within the past 12 months, you worried that your food would run out before you got the money to buy more.: Never true    Within the past 12 months, the food you bought just didn't last and you didn't have money to get more.: Never true  Transportation Needs: Not on file  Physical Activity: Not on file  Stress: Not on file  Social Connections: Unknown  (06/08/2021)   Received from [facility]   Social Network    Social Network: Not on file  Intimate Partner Violence: Unknown (04/30/2021)   Received from [facility]   HITS    Physically Hurt: Not on file    Insult or Talk Down To: Not on file    Threaten Physical Harm: Not on file    Scream or Curse: Not on file   Outpatient Medications Prior to Visit  Medication Sig Dispense Refill   amoxicillin  (AMOXIL ) 875 MG tablet Take 1 tablet (875 mg total) by mouth 2 (two) times daily. 14 tablet 0   Chlorpheniramine -PSE-Ibuprofen  (ADVIL  ALLERGY SINUS) 2-30-200 MG TABS 2 tabs PO QID PRN 20 each 0   cyclobenzaprine  (FLEXERIL ) 10 MG tablet Take 1 tablet (10 mg total) by mouth 2 (two) times daily as needed for muscle spasms. 20 tablet 0   Efinaconazole  10 % SOLN Apply to affected nails daily for 7 days. At the end of 7 days scrape off residue. 8 mL 1   fluticasone  (FLONASE ) 50 MCG/ACT nasal spray Place 2 sprays into both nostrils daily. 9.9 g 2   HYDROcodone -acetaminophen  (NORCO/VICODIN) 5-325 MG per tablet Take 1-2 tablets by mouth every 4 (four) hours as needed. 12 tablet 0   ibuprofen  (ADVIL ,MOTRIN ) 800 MG tablet Take 1 tablet (800 mg total) by mouth 3 (three) times daily. 21 tablet 0   methylPREDNISolone  (MEDROL  DOSEPAK) 4 MG tablet Use as directed 21 tablet 0   naproxen  (NAPROSYN ) 500 MG tablet Take 1 tablet (500 mg total) by mouth 2 (two) times daily with a meal. 30 tablet 0   OVER THE COUNTER MEDICATION Take 10 mLs by mouth every 6 (six) hours as needed. dayquil day and night for cold symptoms     pseudoephedrine  (SUDAFED 12 HOUR) 120 MG 12 hr tablet Take 1 tablet (120 mg total) by mouth 2 (two) times daily. 30 tablet 0   No facility-administered medications prior to visit.   No Known Allergies  ROS: see HPI    Objective   Today's Vitals   10/26/23 1105  BP: 96/68  Pulse: 67  Resp: 16  SpO2: 97%  Weight: 162 lb (73.5 kg)  Height: 5' 7 (1.702 m)  PainSc: 0-No pain   GENERAL:  Well-appearing, in NAD. Well nourished.  SKIN: Pink, warm and dry. No rash, lesion, ulceration, or ecchymoses.  Head: Normocephalic. NECK: Trachea midline. Full ROM w/o pain or tenderness. No lymphadenopathy.  EARS: Tympanic membranes are intact, translucent without bulging and without drainage. Appropriate landmarks visualized.  EYES: Conjunctiva clear without exudates. EOMI, PERRL, no drainage present.  NOSE: Septum midline w/o deformity. Nares patent, mucosa pink and non-inflamed w/o drainage. No sinus tenderness.  THROAT: Uvula midline. Oropharynx clear. Tonsils non-inflamed without exudate. Mucous membranes pink and moist.  RESPIRATORY: Chest wall symmetrical. Respirations even and non-labored. Breath sounds clear to auscultation bilaterally.  CARDIAC: S1, S2 present, regular rate and rhythm without  murmur or gallops. Peripheral pulses 2+ bilaterally.  MSK: Muscle tone and strength appropriate for age. Joints w/o tenderness, redness, or swelling.  EXTREMITIES: Without clubbing, cyanosis, or edema.  NEUROLOGIC: No motor or sensory deficits. Steady, even gait. C2-C12 intact.  PSYCH/MENTAL STATUS: Alert, oriented x 3. Cooperative, appropriate mood and affect.     Assessment & Plan:   1. Encounter to establish care (Primary) Patient is a 21- year-old female who presents today to establish care with primary care at [facility]. Reviewed the past medical history, family history, social history, surgical history, medications and allergies today- updates made as indicated. Patient has concerns today about establishing care.    2. Healthcare maintenance No significant medical history. Family history of diabetes. Previously near prediabetes threshold, improved with weight loss. Requires work-related health exam. Order A1c, kidney function, liver function, thyroid, electrolytes, and blood count tests. Schedule TB skin test for next week, ensure reading within 48-72 hours. Print immunization records to  verify vaccinations. Will schedule for physical in the near future with pap.   Return in about 4 days (around 10/30/2023) for RN visit .   Evalene Arts, FNP

## 2023-10-30 ENCOUNTER — Ambulatory Visit (INDEPENDENT_AMBULATORY_CARE_PROVIDER_SITE_OTHER)

## 2023-10-30 DIAGNOSIS — Z0000 Encounter for general adult medical examination without abnormal findings: Secondary | ICD-10-CM

## 2023-10-30 DIAGNOSIS — Z111 Encounter for screening for respiratory tuberculosis: Secondary | ICD-10-CM | POA: Diagnosis not present

## 2023-10-31 LAB — CBC WITH DIFFERENTIAL/PLATELET
Basophils Absolute: 0 x10E3/uL (ref 0.0–0.2)
Basos: 1 %
EOS (ABSOLUTE): 0.2 x10E3/uL (ref 0.0–0.4)
Eos: 2 %
Hematocrit: 40 % (ref 34.0–46.6)
Hemoglobin: 12.8 g/dL (ref 11.1–15.9)
Immature Grans (Abs): 0 x10E3/uL (ref 0.0–0.1)
Immature Granulocytes: 0 %
Lymphocytes Absolute: 0.8 x10E3/uL (ref 0.7–3.1)
Lymphs: 12 %
MCH: 29.7 pg (ref 26.6–33.0)
MCHC: 32 g/dL (ref 31.5–35.7)
MCV: 93 fL (ref 79–97)
Monocytes Absolute: 0.6 x10E3/uL (ref 0.1–0.9)
Monocytes: 9 %
Neutrophils Absolute: 4.8 x10E3/uL (ref 1.4–7.0)
Neutrophils: 75 %
Platelets: 298 x10E3/uL (ref 150–450)
RBC: 4.31 x10E6/uL (ref 3.77–5.28)
RDW: 11.9 % (ref 11.7–15.4)
WBC: 6.4 x10E3/uL (ref 3.4–10.8)

## 2023-10-31 LAB — LIPID PANEL
Chol/HDL Ratio: 2.7 ratio (ref 0.0–4.4)
Cholesterol, Total: 121 mg/dL (ref 100–199)
HDL: 45 mg/dL (ref >39)
LDL Chol Calc (NIH): 63 mg/dL (ref 0–99)
Triglycerides: 61 mg/dL (ref 0–149)
VLDL Cholesterol Cal: 13 mg/dL (ref 5–40)

## 2023-10-31 LAB — HEMOGLOBIN A1C
Est. average glucose Bld gHb Est-mCnc: 108 mg/dL
Hgb A1c MFr Bld: 5.4 % (ref 4.8–5.6)

## 2023-10-31 LAB — TSH: TSH: 2.28 u[IU]/mL (ref 0.450–4.500)

## 2023-11-02 ENCOUNTER — Ambulatory Visit

## 2023-11-02 ENCOUNTER — Ambulatory Visit: Payer: Self-pay | Admitting: Family Medicine

## 2023-11-02 DIAGNOSIS — Z111 Encounter for screening for respiratory tuberculosis: Secondary | ICD-10-CM

## 2023-11-02 LAB — TB SKIN TEST
Induration: 0 mm
TB Skin Test: NEGATIVE

## 2023-11-02 NOTE — Progress Notes (Signed)
 PPD Reading Note  PPD read and results entered in EpicCare.  Result: 0 mm induration.  Interpretation: Negative  If test not read within 48-72 hours of initial placement, patient advised to repeat in other arm 1-3 weeks after this test.  Allergic reaction: no

## 2024-02-27 ENCOUNTER — Encounter: Payer: Self-pay | Admitting: Family Medicine

## 2024-02-27 ENCOUNTER — Other Ambulatory Visit: Payer: Self-pay | Admitting: Family Medicine

## 2024-02-27 DIAGNOSIS — Z833 Family history of diabetes mellitus: Secondary | ICD-10-CM

## 2024-02-27 DIAGNOSIS — E559 Vitamin D deficiency, unspecified: Secondary | ICD-10-CM

## 2024-02-27 NOTE — Telephone Encounter (Signed)
 Please see mychart message sent by pt and advise.

## 2024-02-28 ENCOUNTER — Telehealth: Payer: Self-pay | Admitting: Family Medicine

## 2024-02-28 NOTE — Telephone Encounter (Signed)
 Copied from CRM [id number]. Topic: Clinical - Request for Lab/Test Order >> Feb 28, 2024  3:36 PM Pinkey ORN wrote: Reason for CRM: Lab >> Feb 28, 2024  3:37 PM Pinkey ORN wrote: Patient is requesting to have her Cortisol levels checked.

## 2024-02-29 ENCOUNTER — Other Ambulatory Visit: Payer: Self-pay | Admitting: Family Medicine

## 2024-02-29 ENCOUNTER — Other Ambulatory Visit

## 2024-02-29 ENCOUNTER — Other Ambulatory Visit: Payer: Self-pay

## 2024-02-29 DIAGNOSIS — R5382 Chronic fatigue, unspecified: Secondary | ICD-10-CM

## 2024-02-29 DIAGNOSIS — Z833 Family history of diabetes mellitus: Secondary | ICD-10-CM

## 2024-02-29 DIAGNOSIS — E559 Vitamin D deficiency, unspecified: Secondary | ICD-10-CM

## 2024-03-01 ENCOUNTER — Ambulatory Visit: Payer: Self-pay | Admitting: Family Medicine

## 2024-03-01 LAB — BASIC METABOLIC PANEL WITH GFR
BUN/Creatinine Ratio: 24 — ABNORMAL HIGH (ref 9–23)
BUN: 16 mg/dL (ref 6–20)
CO2: 24 mmol/L (ref 20–29)
Calcium: 9.1 mg/dL (ref 8.7–10.2)
Chloride: 103 mmol/L (ref 96–106)
Creatinine, Ser: 0.66 mg/dL (ref 0.57–1.00)
Glucose: 82 mg/dL (ref 70–99)
Potassium: 4 mmol/L (ref 3.5–5.2)
Sodium: 140 mmol/L (ref 134–144)
eGFR: 122 mL/min/{1.73_m2}

## 2024-03-01 LAB — INSULIN AND C-PEPTIDE, SERUM
C-Peptide: 3.2 ng/mL (ref 1.1–4.4)
INSULIN: 20.7 u[IU]/mL (ref 2.6–24.9)

## 2024-03-01 LAB — CORTISOL: Cortisol: 15.6 ug/dL (ref 6.2–19.4)

## 2024-03-01 LAB — VITAMIN D 25 HYDROXY (VIT D DEFICIENCY, FRACTURES): Vit D, 25-Hydroxy: 31.2 ng/mL (ref 30.0–100.0)
